# Patient Record
Sex: Female | Born: 1960 | Hispanic: No | Marital: Married | Smoking: Current every day smoker
Health system: Southern US, Community
[De-identification: ages and names within clinical notes are randomized; demographics above are authoritative.]

---

## 1999-01-19 ENCOUNTER — Other Ambulatory Visit: Admission: RE | Admit: 1999-01-19 | Discharge: 1999-01-19 | Payer: Self-pay | Admitting: Obstetrics and Gynecology

## 2000-02-04 ENCOUNTER — Other Ambulatory Visit: Admission: RE | Admit: 2000-02-04 | Discharge: 2000-02-04 | Payer: Self-pay | Admitting: *Deleted

## 2000-06-17 ENCOUNTER — Other Ambulatory Visit: Admission: RE | Admit: 2000-06-17 | Discharge: 2000-06-17 | Payer: Self-pay | Admitting: Obstetrics and Gynecology

## 2001-10-09 ENCOUNTER — Other Ambulatory Visit: Admission: RE | Admit: 2001-10-09 | Discharge: 2001-10-09 | Payer: Self-pay | Admitting: *Deleted

## 2002-10-23 ENCOUNTER — Other Ambulatory Visit: Admission: RE | Admit: 2002-10-23 | Discharge: 2002-10-23 | Payer: Self-pay | Admitting: *Deleted

## 2004-04-03 ENCOUNTER — Encounter: Admission: RE | Admit: 2004-04-03 | Discharge: 2004-04-03 | Payer: Self-pay | Admitting: *Deleted

## 2005-07-13 ENCOUNTER — Encounter: Admission: RE | Admit: 2005-07-13 | Discharge: 2005-07-13 | Payer: Self-pay | Admitting: *Deleted

## 2007-01-05 ENCOUNTER — Encounter: Admission: RE | Admit: 2007-01-05 | Discharge: 2007-01-05 | Payer: Self-pay | Admitting: *Deleted

## 2008-02-07 ENCOUNTER — Encounter: Admission: RE | Admit: 2008-02-07 | Discharge: 2008-02-07 | Payer: Self-pay | Admitting: Emergency Medicine

## 2008-02-09 ENCOUNTER — Encounter: Admission: RE | Admit: 2008-02-09 | Discharge: 2008-02-09 | Payer: Self-pay | Admitting: Emergency Medicine

## 2008-08-28 ENCOUNTER — Encounter: Admission: RE | Admit: 2008-08-28 | Discharge: 2008-08-28 | Payer: Self-pay | Admitting: Emergency Medicine

## 2009-04-01 ENCOUNTER — Encounter: Admission: RE | Admit: 2009-04-01 | Discharge: 2009-04-01 | Payer: Self-pay | Admitting: Emergency Medicine

## 2011-10-20 ENCOUNTER — Other Ambulatory Visit: Payer: Self-pay | Admitting: Obstetrics & Gynecology

## 2011-10-20 DIAGNOSIS — Z1231 Encounter for screening mammogram for malignant neoplasm of breast: Secondary | ICD-10-CM

## 2011-11-02 ENCOUNTER — Ambulatory Visit
Admission: RE | Admit: 2011-11-02 | Discharge: 2011-11-02 | Disposition: A | Discharge status: Home / Self Care | Payer: PRIVATE HEALTH INSURANCE | Source: Ambulatory Visit | Attending: Obstetrics & Gynecology | Admitting: Obstetrics & Gynecology

## 2011-11-02 DIAGNOSIS — Z1231 Encounter for screening mammogram for malignant neoplasm of breast: Secondary | ICD-10-CM

## 2013-06-25 ENCOUNTER — Other Ambulatory Visit: Payer: Self-pay

## 2013-06-25 DIAGNOSIS — Z1231 Encounter for screening mammogram for malignant neoplasm of breast: Secondary | ICD-10-CM

## 2013-07-10 ENCOUNTER — Ambulatory Visit
Admission: RE | Admit: 2013-07-10 | Discharge: 2013-07-10 | Disposition: A | Discharge status: Home / Self Care | Payer: 59 | Source: Ambulatory Visit

## 2013-07-10 DIAGNOSIS — Z1231 Encounter for screening mammogram for malignant neoplasm of breast: Secondary | ICD-10-CM

## 2013-10-20 ENCOUNTER — Emergency Department (HOSPITAL_COMMUNITY)
Admission: EM | Admit: 2013-10-20 | Discharge: 2013-10-20 | Disposition: A | Discharge status: Home / Self Care | Payer: 59 | Source: Home / Self Care | Attending: Emergency Medicine | Admitting: Emergency Medicine

## 2013-10-20 ENCOUNTER — Encounter (HOSPITAL_COMMUNITY): Payer: Self-pay | Admitting: Emergency Medicine

## 2013-10-20 ENCOUNTER — Emergency Department (INDEPENDENT_AMBULATORY_CARE_PROVIDER_SITE_OTHER): Payer: 59

## 2013-10-20 DIAGNOSIS — M79673 Pain in unspecified foot: Secondary | ICD-10-CM

## 2013-10-20 DIAGNOSIS — Y92410 Unspecified street and highway as the place of occurrence of the external cause: Secondary | ICD-10-CM

## 2013-10-20 DIAGNOSIS — M79609 Pain in unspecified limb: Secondary | ICD-10-CM

## 2013-10-20 DIAGNOSIS — W19XXXA Unspecified fall, initial encounter: Secondary | ICD-10-CM

## 2013-10-20 DIAGNOSIS — W010XXA Fall on same level from slipping, tripping and stumbling without subsequent striking against object, initial encounter: Secondary | ICD-10-CM

## 2013-10-20 MED ORDER — TRAMADOL HCL 50 MG PO TABS: 50.0000 mg | ORAL_TABLET | Freq: Four times a day (QID) | ORAL | Status: AC | PRN

## 2013-10-20 MED ORDER — NAPROXEN 500 MG PO TABS: 500.0000 mg | ORAL_TABLET | Freq: Two times a day (BID) | ORAL | Status: AC

## 2013-10-20 NOTE — ED Provider Notes (Signed)
CSN: [id number]     Arrival date & time 10/20/13  0910 History   First MD Initiated Contact with Patient 10/20/13 [phone]     Chief Complaint  Patient presents with  . Foot Injury   (Consider location/radiation/quality/duration/timing/severity/associated sxs/prior Treatment) HPI Comments: 53 year old female presents for evaluation of left foot injury last night. She was running and tripped over her dog. Since then, she has had severe pain across the top of her foot that is worse with any weightbearing activity. She does not have any specific areas of pain, she says the whole foot hurts.  She has some mild swelling as well. No numbness in the foot. No other injuries.   History reviewed. No pertinent past medical history. History reviewed. No pertinent past surgical history. No family history on file. History  Substance Use Topics  . Smoking status: Current Every Day Smoker  . Smokeless tobacco: Not on file  . Alcohol Use: Yes   OB History   Grav Para Term Preterm Abortions TAB SAB Ect Mult Living                 Review of Systems  Musculoskeletal:       See HPI  All other systems reviewed and are negative.   Allergies  Review of patient's allergies indicates no known allergies.  Home Medications   Prior to Admission medications   Not on File   BP 150/82  Pulse 83  Temp(Src) 98 F (36.7 C) (Oral)  Resp 16  SpO2 99% Physical Exam  Nursing note and vitals reviewed. Constitutional: She is oriented to person, place, and time. Vital signs are normal. She appears well-developed and well-nourished. No distress.  HENT:  Head: Normocephalic and atraumatic.  Pulmonary/Chest: Effort normal. No respiratory distress.  Musculoskeletal:       Left foot: She exhibits no tenderness, no bony tenderness, no swelling and normal capillary refill.  2+ pedal pulses. No decreased sensation. Range of motion is intact, but with significant pain in the foot. Objectively, the foot is normal.   Neurological: She is alert and oriented to person, place, and time. She has normal strength. Coordination normal.  Skin: Skin is warm and dry. No rash noted. She is not diaphoretic.  Psychiatric: She has a normal mood and affect. Judgment normal.    ED Course  Procedures (including critical care time) Labs Review Labs Reviewed - No data to display  Imaging Review Dg Foot Complete Left  10/20/2013   CLINICAL DATA:  Fall with foot pain.  EXAM: LEFT FOOT - COMPLETE 3+ VIEW  COMPARISON:  None.  FINDINGS: There is no evidence of fracture or dislocation. There is no evidence of arthropathy or other focal bone abnormality. Soft tissues are unremarkable.  IMPRESSION: Negative.   Electronically Signed   By: Tiburcio PeaJonathan  Gilmore M.D.   On: 10/20/2013 09:45     MDM   1. Fall   2. Foot pain    Given her level of pain, she may have a ligamentous injury within the foot. Will use a walking boot and crutches, she will take Naprosyn twice a day and tramadol when necessary. Referral provided for orthopedics, she will followup there if no improvement by Tuesday or Wednesday of next week  New Prescriptions   NAPROXEN (NAPROSYN) 500 MG TABLET    Take 1 tablet (500 mg total) by mouth 2 (two) times daily.   TRAMADOL (ULTRAM) 50 MG TABLET    Take 1 tablet (50 mg total) by mouth every 6 (  six) hours as needed.       Tracy GoodZachary H Baker, PA-C 10/20/13 1023

## 2013-10-20 NOTE — ED Notes (Signed)
Pt c/o left foot inj onset last night  Reports she was running and tripped over dog Sx include swelling, bruising and pain to top of foot Alert w/no signs of acute distress.

## 2013-10-20 NOTE — Discharge Instructions (Signed)
Musculoskeletal Pain °Musculoskeletal pain is muscle and boney aches and pains. These pains can occur in any part of the body. Your caregiver may treat you without knowing the cause of the pain. They may treat you if blood or urine tests, X-rays, and other tests were normal.  °CAUSES °There is often not a definite cause or reason for these pains. These pains may be caused by a type of germ (virus). The discomfort may also come from overuse. Overuse includes working out too hard when your body is not fit. Boney aches also come from weather changes. Bone is sensitive to atmospheric pressure changes. °HOME CARE INSTRUCTIONS  °· Ask when your test results will be ready. Make sure you get your test results. °· Only take over-the-counter or prescription medicines for pain, discomfort, or fever as directed by your caregiver. If you were given medications for your condition, do not drive, operate machinery or power tools, or sign legal documents for 24 hours. Do not drink alcohol. Do not take sleeping pills or other medications that may interfere with treatment. °· Continue all activities unless the activities cause more pain. When the pain lessens, slowly resume normal activities. Gradually increase the intensity and duration of the activities or exercise. °· During periods of severe pain, bed rest may be helpful. Lay or sit in any position that is comfortable. °· Putting ice on the injured area. °· Put ice in a bag. °· Place a towel between your skin and the bag. °· Leave the ice on for 15 to 20 minutes, 3 to 4 times a day. °· Follow up with your caregiver for continued problems and no reason can be found for the pain. If the pain becomes worse or does not go away, it may be necessary to repeat tests or do additional testing. Your caregiver may need to look further for a possible cause. °SEEK IMMEDIATE MEDICAL CARE IF: °· You have pain that is getting worse and is not relieved by medications. °· You develop chest pain  that is associated with shortness or breath, sweating, feeling sick to your stomach (nauseous), or throw up (vomit). °· Your pain becomes localized to the abdomen. °· You develop any new symptoms that seem different or that concern you. °MAKE SURE YOU:  °· Understand these instructions. °· Will watch your condition. °· Will get help right away if you are not doing well or get worse. °Document Released: 05/24/2005 Document Revised: 08/16/2011 Document Reviewed: 01/26/2013 °[employer]® Patient Information ©2014 [employer]. ° °

## 2013-10-22 NOTE — ED Provider Notes (Signed)
Medical screening examination/treatment/procedure(s) were performed by non-physician practitioner and as supervising physician I was immediately available for consultation/collaboration.  Leslee Homeavid Keller, M.D.  Reuben Likesavid C Keller, MD 10/22/13 [phone]

## 2014-03-08 ENCOUNTER — Encounter: Payer: 59 | Admitting: Internal Medicine

## 2014-08-09 ENCOUNTER — Other Ambulatory Visit: Payer: Self-pay

## 2014-08-09 DIAGNOSIS — Z1231 Encounter for screening mammogram for malignant neoplasm of breast: Secondary | ICD-10-CM

## 2014-08-13 ENCOUNTER — Ambulatory Visit
Admission: RE | Admit: 2014-08-13 | Discharge: 2014-08-13 | Disposition: A | Discharge status: Home / Self Care | Payer: Commercial Managed Care - PPO | Source: Ambulatory Visit

## 2014-08-13 DIAGNOSIS — Z1231 Encounter for screening mammogram for malignant neoplasm of breast: Secondary | ICD-10-CM

## 2014-08-14 ENCOUNTER — Other Ambulatory Visit: Payer: Self-pay | Admitting: Family Medicine

## 2014-08-14 DIAGNOSIS — R928 Other abnormal and inconclusive findings on diagnostic imaging of breast: Secondary | ICD-10-CM

## 2014-08-20 ENCOUNTER — Ambulatory Visit
Admission: RE | Admit: 2014-08-20 | Discharge: 2014-08-20 | Disposition: A | Discharge status: Home / Self Care | Payer: Commercial Managed Care - PPO | Source: Ambulatory Visit | Attending: Family Medicine | Admitting: Family Medicine

## 2014-08-20 ENCOUNTER — Other Ambulatory Visit: Payer: Self-pay | Admitting: Family Medicine

## 2014-08-20 DIAGNOSIS — R928 Other abnormal and inconclusive findings on diagnostic imaging of breast: Secondary | ICD-10-CM

## 2015-04-03 ENCOUNTER — Other Ambulatory Visit: Payer: Self-pay | Admitting: Family Medicine

## 2015-04-03 DIAGNOSIS — R921 Mammographic calcification found on diagnostic imaging of breast: Secondary | ICD-10-CM

## 2015-04-16 ENCOUNTER — Ambulatory Visit
Admission: RE | Admit: 2015-04-16 | Discharge: 2015-04-16 | Disposition: A | Discharge status: Home / Self Care | Payer: Commercial Managed Care - PPO | Source: Ambulatory Visit | Attending: Family Medicine | Admitting: Family Medicine

## 2015-04-16 DIAGNOSIS — R921 Mammographic calcification found on diagnostic imaging of breast: Secondary | ICD-10-CM

## 2015-10-09 ENCOUNTER — Other Ambulatory Visit: Payer: Self-pay | Admitting: Obstetrics & Gynecology

## 2015-10-09 DIAGNOSIS — R921 Mammographic calcification found on diagnostic imaging of breast: Secondary | ICD-10-CM

## 2015-11-27 ENCOUNTER — Ambulatory Visit
Admission: RE | Admit: 2015-11-27 | Discharge: 2015-11-27 | Disposition: A | Discharge status: Home / Self Care | Payer: Commercial Managed Care - PPO | Source: Ambulatory Visit | Attending: Obstetrics & Gynecology | Admitting: Obstetrics & Gynecology

## 2015-11-27 DIAGNOSIS — R921 Mammographic calcification found on diagnostic imaging of breast: Secondary | ICD-10-CM

## 2016-11-23 ENCOUNTER — Other Ambulatory Visit: Payer: Self-pay | Admitting: Obstetrics & Gynecology

## 2016-11-23 DIAGNOSIS — R921 Mammographic calcification found on diagnostic imaging of breast: Secondary | ICD-10-CM

## 2016-11-30 ENCOUNTER — Ambulatory Visit
Admission: RE | Admit: 2016-11-30 | Discharge: 2016-11-30 | Disposition: A | Discharge status: Home / Self Care | Payer: Commercial Managed Care - PPO | Source: Ambulatory Visit | Attending: Obstetrics & Gynecology | Admitting: Obstetrics & Gynecology

## 2016-11-30 DIAGNOSIS — R921 Mammographic calcification found on diagnostic imaging of breast: Secondary | ICD-10-CM

## 2016-11-30 DIAGNOSIS — G43109 Migraine with aura, not intractable, without status migrainosus: Secondary | ICD-10-CM | POA: Diagnosis not present

## 2016-11-30 DIAGNOSIS — R928 Other abnormal and inconclusive findings on diagnostic imaging of breast: Secondary | ICD-10-CM | POA: Diagnosis not present

## 2017-01-14 DIAGNOSIS — Z01419 Encounter for gynecological examination (general) (routine) without abnormal findings: Secondary | ICD-10-CM | POA: Diagnosis not present

## 2017-01-14 DIAGNOSIS — Z6820 Body mass index (BMI) 20.0-20.9, adult: Secondary | ICD-10-CM | POA: Diagnosis not present

## 2017-05-20 DIAGNOSIS — G4700 Insomnia, unspecified: Secondary | ICD-10-CM | POA: Diagnosis not present

## 2017-05-20 DIAGNOSIS — Z1211 Encounter for screening for malignant neoplasm of colon: Secondary | ICD-10-CM | POA: Diagnosis not present

## 2017-10-07 DIAGNOSIS — L720 Epidermal cyst: Secondary | ICD-10-CM | POA: Diagnosis not present

## 2017-10-07 DIAGNOSIS — R030 Elevated blood-pressure reading, without diagnosis of hypertension: Secondary | ICD-10-CM | POA: Diagnosis not present

## 2017-10-24 DIAGNOSIS — I10 Essential (primary) hypertension: Secondary | ICD-10-CM | POA: Diagnosis not present

## 2017-10-24 DIAGNOSIS — G4700 Insomnia, unspecified: Secondary | ICD-10-CM | POA: Diagnosis not present

## 2017-10-24 DIAGNOSIS — M7989 Other specified soft tissue disorders: Secondary | ICD-10-CM | POA: Diagnosis not present

## 2017-12-23 DIAGNOSIS — I10 Essential (primary) hypertension: Secondary | ICD-10-CM | POA: Diagnosis not present

## 2018-03-08 ENCOUNTER — Other Ambulatory Visit: Payer: Self-pay | Admitting: Obstetrics & Gynecology

## 2018-03-08 DIAGNOSIS — Z1231 Encounter for screening mammogram for malignant neoplasm of breast: Secondary | ICD-10-CM

## 2018-04-14 DIAGNOSIS — F17200 Nicotine dependence, unspecified, uncomplicated: Secondary | ICD-10-CM | POA: Diagnosis not present

## 2018-04-14 DIAGNOSIS — I10 Essential (primary) hypertension: Secondary | ICD-10-CM | POA: Diagnosis not present

## 2018-04-14 DIAGNOSIS — Z23 Encounter for immunization: Secondary | ICD-10-CM | POA: Diagnosis not present

## 2018-04-19 ENCOUNTER — Ambulatory Visit
Admission: RE | Admit: 2018-04-19 | Discharge: 2018-04-19 | Disposition: A | Discharge status: Home / Self Care | Payer: Commercial Managed Care - PPO | Source: Ambulatory Visit | Attending: Obstetrics & Gynecology | Admitting: Obstetrics & Gynecology

## 2018-04-19 DIAGNOSIS — Z1231 Encounter for screening mammogram for malignant neoplasm of breast: Secondary | ICD-10-CM

## 2018-06-16 DIAGNOSIS — Z6821 Body mass index (BMI) 21.0-21.9, adult: Secondary | ICD-10-CM | POA: Diagnosis not present

## 2018-06-16 DIAGNOSIS — F17210 Nicotine dependence, cigarettes, uncomplicated: Secondary | ICD-10-CM | POA: Diagnosis not present

## 2018-06-16 DIAGNOSIS — Z01419 Encounter for gynecological examination (general) (routine) without abnormal findings: Secondary | ICD-10-CM | POA: Diagnosis not present

## 2018-06-28 ENCOUNTER — Telehealth: Payer: Self-pay | Admitting: Acute Care

## 2018-06-29 NOTE — Telephone Encounter (Signed)
LMTC x 1  

## 2018-07-05 NOTE — Telephone Encounter (Signed)
LMTC x 1  

## 2018-07-07 NOTE — Telephone Encounter (Signed)
Will close this message due to no response from pt.

## 2018-08-03 DIAGNOSIS — G4700 Insomnia, unspecified: Secondary | ICD-10-CM | POA: Diagnosis not present

## 2018-08-03 DIAGNOSIS — Z1211 Encounter for screening for malignant neoplasm of colon: Secondary | ICD-10-CM | POA: Diagnosis not present

## 2019-06-11 ENCOUNTER — Other Ambulatory Visit: Payer: Self-pay | Admitting: Obstetrics & Gynecology

## 2019-06-11 DIAGNOSIS — Z1231 Encounter for screening mammogram for malignant neoplasm of breast: Secondary | ICD-10-CM

## 2019-07-19 ENCOUNTER — Ambulatory Visit
Admission: RE | Admit: 2019-07-19 | Discharge: 2019-07-19 | Disposition: A | Discharge status: Home / Self Care | Payer: 59 | Source: Ambulatory Visit | Attending: Obstetrics & Gynecology | Admitting: Obstetrics & Gynecology

## 2019-07-19 ENCOUNTER — Other Ambulatory Visit: Payer: Self-pay

## 2019-07-19 DIAGNOSIS — Z1231 Encounter for screening mammogram for malignant neoplasm of breast: Secondary | ICD-10-CM

## 2020-07-30 ENCOUNTER — Other Ambulatory Visit: Payer: Self-pay | Admitting: Obstetrics & Gynecology

## 2020-07-30 DIAGNOSIS — Z1231 Encounter for screening mammogram for malignant neoplasm of breast: Secondary | ICD-10-CM

## 2020-09-18 ENCOUNTER — Other Ambulatory Visit: Payer: Self-pay

## 2020-09-18 ENCOUNTER — Ambulatory Visit
Admission: RE | Admit: 2020-09-18 | Discharge: 2020-09-18 | Disposition: A | Discharge status: Home / Self Care | Payer: 59 | Source: Ambulatory Visit | Attending: Obstetrics & Gynecology | Admitting: Obstetrics & Gynecology

## 2020-09-18 DIAGNOSIS — Z1231 Encounter for screening mammogram for malignant neoplasm of breast: Secondary | ICD-10-CM

## 2021-08-20 ENCOUNTER — Other Ambulatory Visit: Payer: Self-pay | Admitting: Obstetrics & Gynecology

## 2021-09-24 ENCOUNTER — Ambulatory Visit: Payer: Self-pay

## 2021-11-19 ENCOUNTER — Ambulatory Visit: Payer: Self-pay

## 2022-02-02 DIAGNOSIS — Z6820 Body mass index (BMI) 20.0-20.9, adult: Secondary | ICD-10-CM | POA: Diagnosis not present

## 2022-02-02 DIAGNOSIS — Z01419 Encounter for gynecological examination (general) (routine) without abnormal findings: Secondary | ICD-10-CM | POA: Diagnosis not present

## 2022-03-02 DIAGNOSIS — E785 Hyperlipidemia, unspecified: Secondary | ICD-10-CM | POA: Diagnosis not present

## 2022-04-08 DIAGNOSIS — I10 Essential (primary) hypertension: Secondary | ICD-10-CM | POA: Diagnosis not present

## 2022-04-08 DIAGNOSIS — R69 Illness, unspecified: Secondary | ICD-10-CM | POA: Diagnosis not present

## 2022-04-08 DIAGNOSIS — G4700 Insomnia, unspecified: Secondary | ICD-10-CM | POA: Diagnosis not present

## 2022-08-16 ENCOUNTER — Other Ambulatory Visit (HOSPITAL_COMMUNITY): Payer: Self-pay | Admitting: Obstetrics & Gynecology

## 2022-08-16 DIAGNOSIS — Z1231 Encounter for screening mammogram for malignant neoplasm of breast: Secondary | ICD-10-CM

## 2022-08-26 ENCOUNTER — Other Ambulatory Visit: Payer: Self-pay | Admitting: Family Medicine

## 2022-08-26 ENCOUNTER — Ambulatory Visit
Admission: RE | Admit: 2022-08-26 | Discharge: 2022-08-26 | Disposition: A | Discharge status: Home / Self Care | Payer: 59 | Source: Ambulatory Visit | Attending: Family Medicine | Admitting: Family Medicine

## 2022-08-26 DIAGNOSIS — Z1231 Encounter for screening mammogram for malignant neoplasm of breast: Secondary | ICD-10-CM | POA: Diagnosis not present

## 2022-10-01 IMAGING — MG MM DIGITAL SCREENING BILAT W/ TOMO AND CAD
8 series · 9 of 24 positions shown · non-contrast
Comparison: Previous exam(s).

CLINICAL DATA: Screening.

EXAM:
DIGITAL SCREENING BILATERAL MAMMOGRAM WITH TOMOSYNTHESIS AND CAD
TECHNIQUE: Bilateral screening digital craniocaudal and mediolateral oblique
mammograms were obtained. Bilateral screening digital breast
tomosynthesis was performed. The images were evaluated with
computer-aided detection.

[R CC synth-2D]
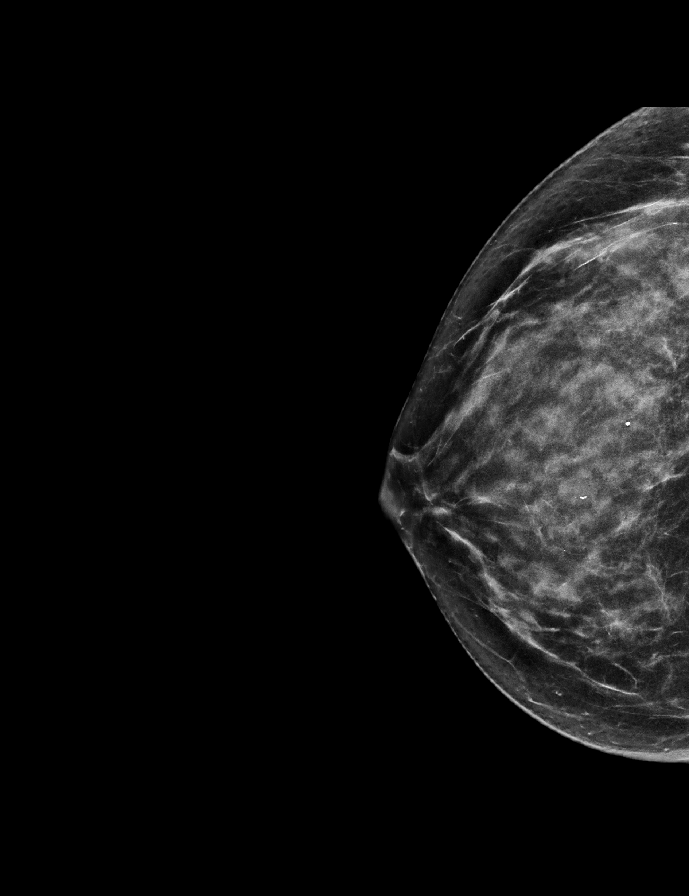

[R MLO synth-2D]
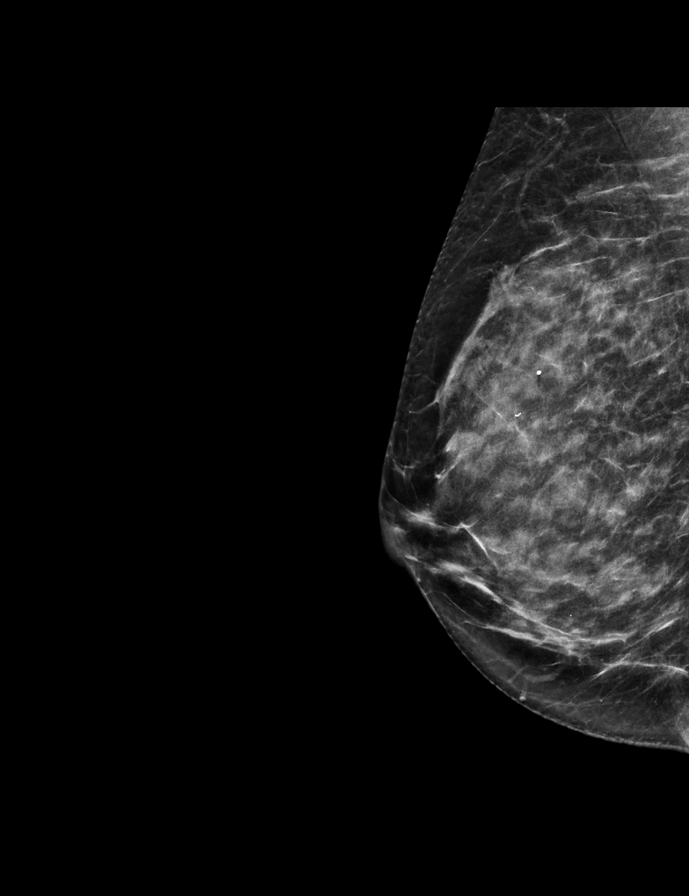

[L MLO synth-2D]
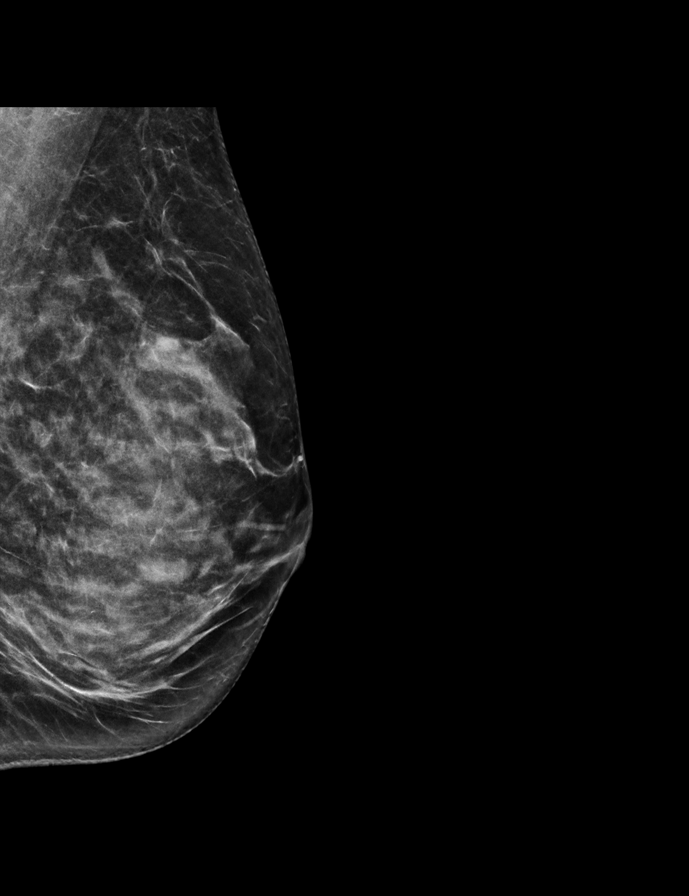

[L CC synth-2D]
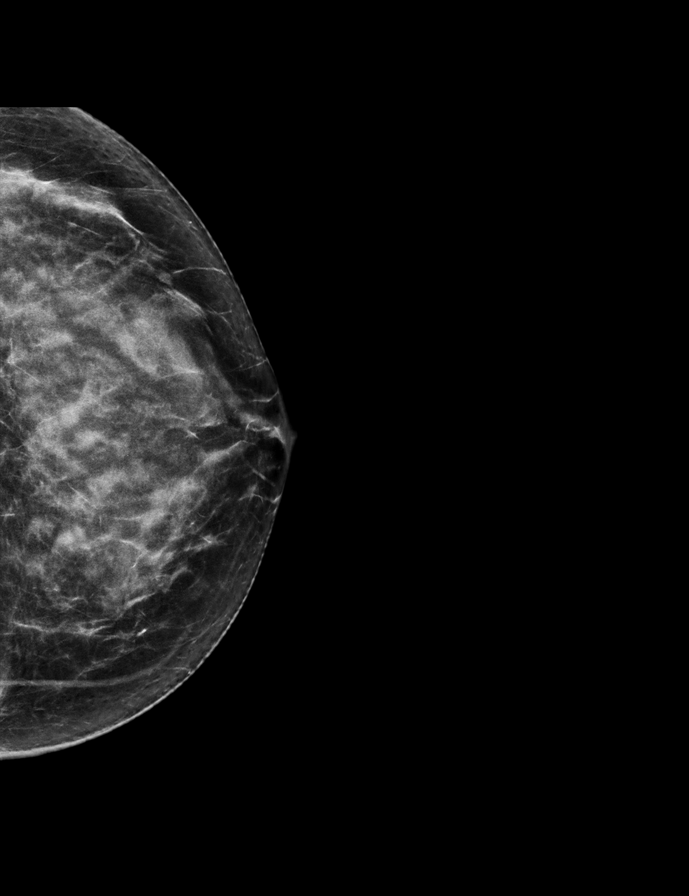

[R MLO tomo · 2 of 56 frames shown]
[frame 19/56]
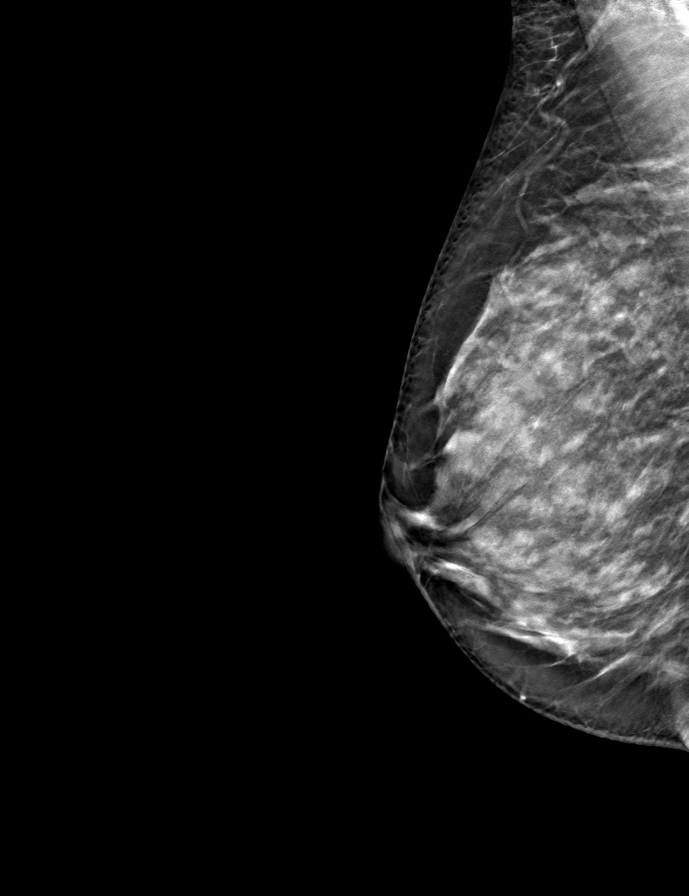
[frame 29/56]
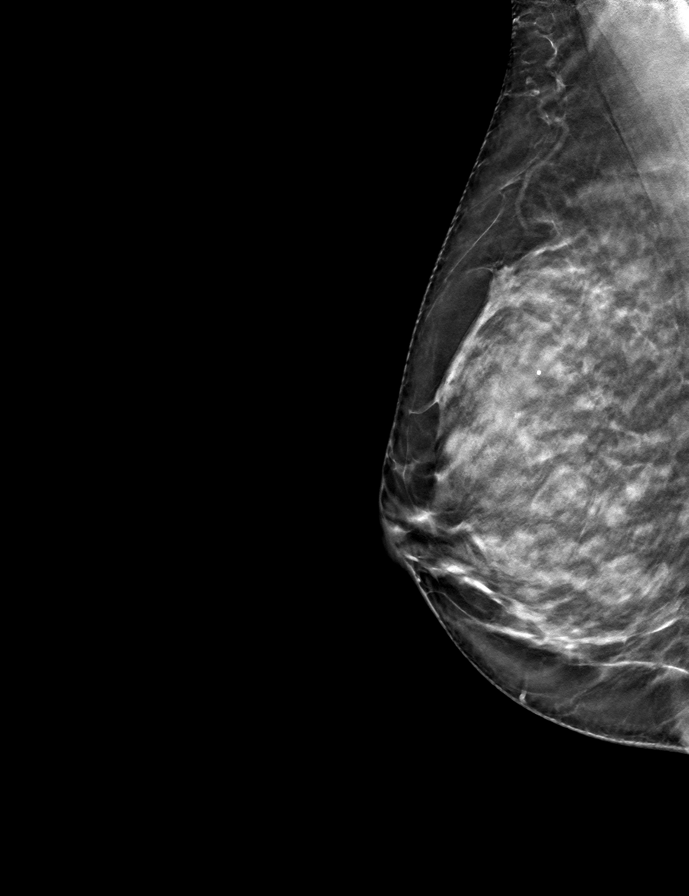

[L CC tomo · tomo slice 31/60.0]
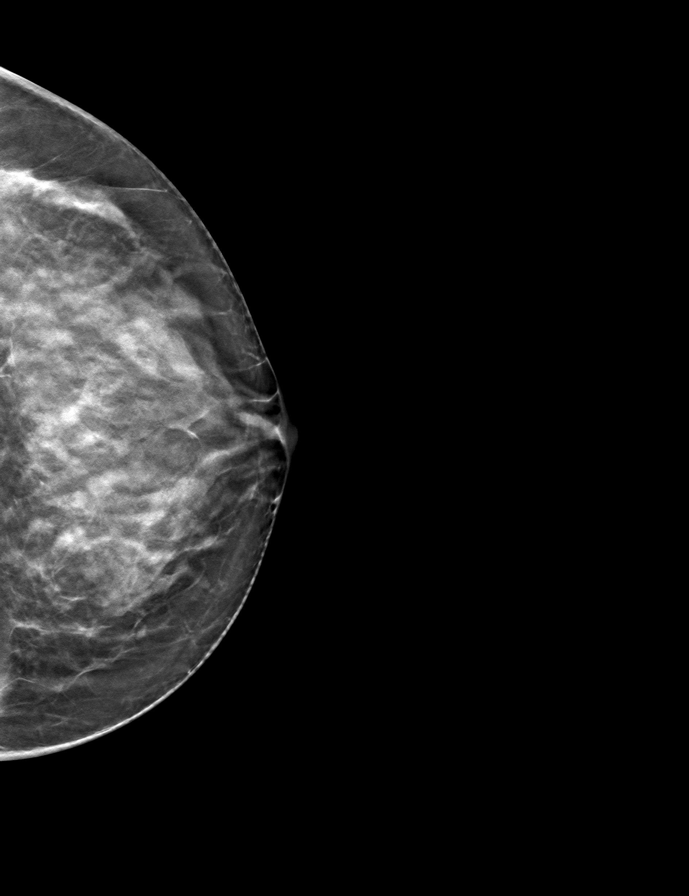

[R CC tomo · tomo slice 29/58.0]
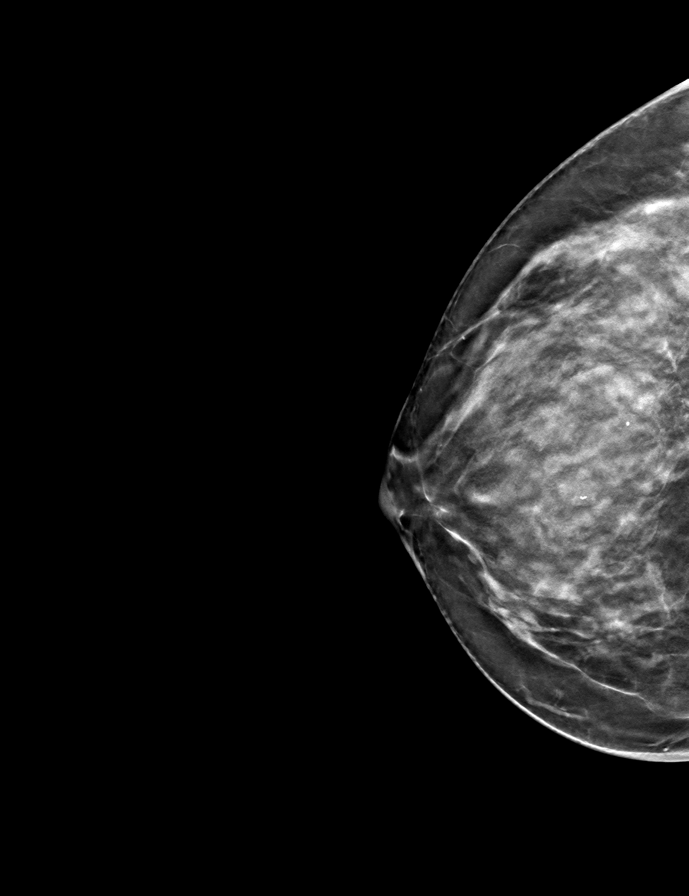

[L MLO tomo · tomo slice 29/58.0]
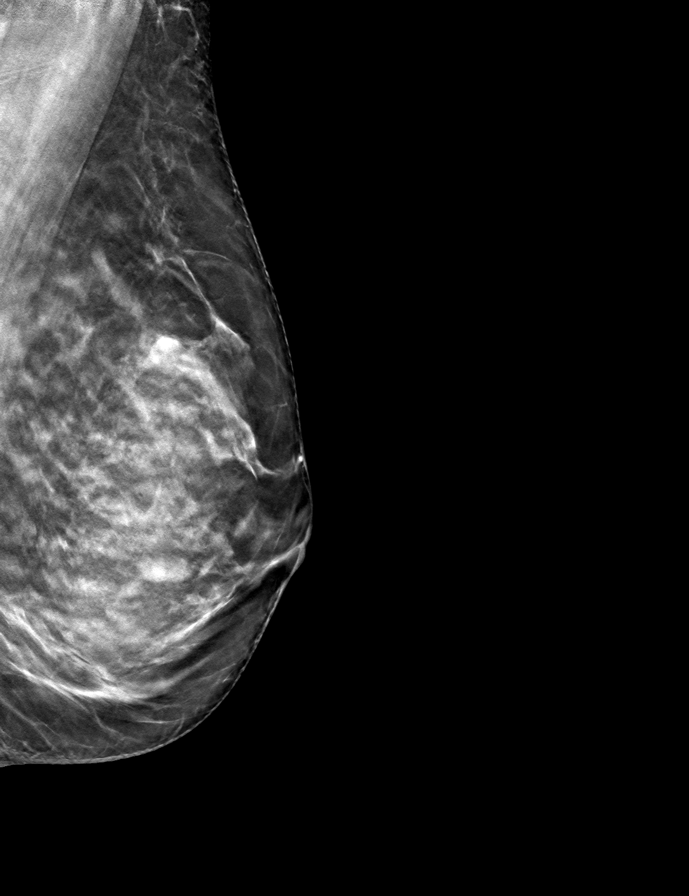

[9 of 24 positions shown; findings below may reference images not displayed]

ACR Breast Density Category c: The breast tissue is heterogeneously
dense, which may obscure small masses.
FINDINGS: There are no findings suspicious for malignancy. The images were
evaluated with computer-aided detection.
IMPRESSION: No mammographic evidence of malignancy. A result letter of this
screening mammogram will be mailed directly to the patient.

RECOMMENDATION:
Screening mammogram in one year. (Code:T4-5-GWO)

BI-RADS CATEGORY  1: Negative.

## 2022-10-29 DIAGNOSIS — I10 Essential (primary) hypertension: Secondary | ICD-10-CM | POA: Diagnosis not present

## 2022-10-29 DIAGNOSIS — G4700 Insomnia, unspecified: Secondary | ICD-10-CM | POA: Diagnosis not present

## 2022-10-29 DIAGNOSIS — F17200 Nicotine dependence, unspecified, uncomplicated: Secondary | ICD-10-CM | POA: Diagnosis not present

## 2022-10-29 DIAGNOSIS — F411 Generalized anxiety disorder: Secondary | ICD-10-CM | POA: Diagnosis not present

## 2023-02-17 DIAGNOSIS — F17200 Nicotine dependence, unspecified, uncomplicated: Secondary | ICD-10-CM | POA: Diagnosis not present

## 2023-02-17 DIAGNOSIS — J019 Acute sinusitis, unspecified: Secondary | ICD-10-CM | POA: Diagnosis not present

## 2023-02-17 DIAGNOSIS — I10 Essential (primary) hypertension: Secondary | ICD-10-CM | POA: Diagnosis not present

## 2023-02-17 DIAGNOSIS — B9689 Other specified bacterial agents as the cause of diseases classified elsewhere: Secondary | ICD-10-CM | POA: Diagnosis not present

## 2023-03-03 DIAGNOSIS — R7309 Other abnormal glucose: Secondary | ICD-10-CM | POA: Diagnosis not present

## 2023-03-03 DIAGNOSIS — I10 Essential (primary) hypertension: Secondary | ICD-10-CM | POA: Diagnosis not present

## 2023-03-29 DIAGNOSIS — Z1211 Encounter for screening for malignant neoplasm of colon: Secondary | ICD-10-CM | POA: Diagnosis not present

## 2023-04-27 DIAGNOSIS — Z113 Encounter for screening for infections with a predominantly sexual mode of transmission: Secondary | ICD-10-CM | POA: Diagnosis not present

## 2023-04-27 DIAGNOSIS — Z124 Encounter for screening for malignant neoplasm of cervix: Secondary | ICD-10-CM | POA: Diagnosis not present

## 2023-04-27 DIAGNOSIS — Z01411 Encounter for gynecological examination (general) (routine) with abnormal findings: Secondary | ICD-10-CM | POA: Diagnosis not present

## 2023-04-27 DIAGNOSIS — Z1331 Encounter for screening for depression: Secondary | ICD-10-CM | POA: Diagnosis not present

## 2023-04-27 DIAGNOSIS — Z01419 Encounter for gynecological examination (general) (routine) without abnormal findings: Secondary | ICD-10-CM | POA: Diagnosis not present

## 2023-05-18 ENCOUNTER — Encounter: Payer: Self-pay | Admitting: Emergency Medicine

## 2023-12-05 ENCOUNTER — Other Ambulatory Visit (HOSPITAL_COMMUNITY): Payer: Self-pay | Admitting: Family Medicine

## 2023-12-05 DIAGNOSIS — F17200 Nicotine dependence, unspecified, uncomplicated: Secondary | ICD-10-CM

## 2023-12-05 DIAGNOSIS — Z122 Encounter for screening for malignant neoplasm of respiratory organs: Secondary | ICD-10-CM

## 2024-02-02 ENCOUNTER — Ambulatory Visit: Admitting: Family Medicine
